# Patient Record
Sex: Male | Born: 1970 | Race: White | Hispanic: No | Smoking: Heavy tobacco smoker
Health system: Southern US, Community
[De-identification: ages and names within clinical notes are randomized; demographics above are authoritative.]

---

## 2019-05-18 ENCOUNTER — Emergency Department (HOSPITAL_COMMUNITY): Payer: Self-pay

## 2019-05-18 ENCOUNTER — Other Ambulatory Visit: Payer: Self-pay

## 2019-05-18 ENCOUNTER — Encounter (HOSPITAL_COMMUNITY): Payer: Self-pay | Admitting: Emergency Medicine

## 2019-05-18 ENCOUNTER — Emergency Department (HOSPITAL_COMMUNITY)
Admission: EM | Admit: 2019-05-18 | Discharge: 2019-05-18 | Disposition: A | Discharge status: Home / Self Care | Payer: Self-pay | Attending: Emergency Medicine | Admitting: Emergency Medicine

## 2019-05-18 DIAGNOSIS — R05 Cough: Secondary | ICD-10-CM | POA: Insufficient documentation

## 2019-05-18 DIAGNOSIS — F17210 Nicotine dependence, cigarettes, uncomplicated: Secondary | ICD-10-CM | POA: Insufficient documentation

## 2019-05-18 DIAGNOSIS — U071 COVID-19: Secondary | ICD-10-CM | POA: Insufficient documentation

## 2019-05-18 DIAGNOSIS — Z20822 Contact with and (suspected) exposure to covid-19: Secondary | ICD-10-CM

## 2019-05-18 MED ORDER — PREDNISONE 10 MG (21) PO TBPK: ORAL_TABLET | ORAL | 0 refills | Status: AC

## 2019-05-18 MED ORDER — DEXAMETHASONE SODIUM PHOSPHATE 10 MG/ML IJ SOLN
10.0000 mg | Freq: Once | INTRAMUSCULAR | Status: AC
  Administered 2019-05-18: 10 mg via INTRAMUSCULAR
  Filled 2019-05-18: qty 1

## 2019-05-18 MED ORDER — AEROCHAMBER PLUS FLO-VU MEDIUM MISC
1.0000 | Freq: Once | Status: AC
  Administered 2019-05-18: 1
  Filled 2019-05-18: qty 1

## 2019-05-18 MED ORDER — ALBUTEROL SULFATE HFA 108 (90 BASE) MCG/ACT IN AERS
2.0000 | INHALATION_SPRAY | RESPIRATORY_TRACT | Status: DC | PRN
  Filled 2019-05-18: qty 6.7

## 2019-05-18 MED ORDER — ACETAMINOPHEN 500 MG PO TABS
1000.0000 mg | ORAL_TABLET | Freq: Once | ORAL | Status: AC
  Administered 2019-05-18: 22:00:00 1000 mg via ORAL
  Filled 2019-05-18: qty 2

## 2019-05-18 NOTE — ED Triage Notes (Signed)
Pt reports body aches and cough since yesterday along with fever but not obtained at home.

## 2019-05-18 NOTE — ED Provider Notes (Signed)
[facility]-EMERGENCY DEPT Provider Note   CSN: [id number] Arrival date & time: 05/18/19  2028     History   Chief Complaint Chief Complaint  Patient presents with  . Generalized Body Aches  . Cough    HPI Brett Holloway is a 48 y.o. male.     Pt presents to the ED today with "flu-like symptoms."  The pt said he started getting sick yesterday.  He took a nyquil which has not helped.  He felt like he had a fever at home.  The pt has not taken any tylenol or ibuprofen today.  No known covid exposures.     History reviewed. No pertinent past medical history.  There are no active problems to display for this patient.   History reviewed. No pertinent surgical history.      Home Medications    Prior to Admission medications   Medication Sig Start Date End Date Taking? Authorizing Provider  predniSONE (STERAPRED UNI-PAK 21 TAB) 10 MG (21) TBPK tablet Take 6 tabs for 2 days, then 5 for 2 days, then 4 for 2 days, then 3 for 2 days, 2 for 2 days, then 1 for 2 days 05/18/19   Jacalyn Lefevre, MD    Family History No family history on file.  Social History Social History   Tobacco Use  . Smoking status: Heavy Tobacco Smoker    Packs/day: 0.50    Types: Cigarettes  . Smokeless tobacco: Never Used  Substance Use Topics  . Alcohol use: Never    Frequency: Never  . Drug use: Never     Allergies   Sulfa antibiotics   Review of Systems Review of Systems  Constitutional: Positive for fever.  Respiratory: Positive for cough.   All other systems reviewed and are negative.    Physical Exam Updated Vital Signs BP 113/63   Pulse 88   Temp 99.5 F (37.5 C) (Oral)   Resp 15   Ht 5\' 5"  (1.651 m)   Wt 59 kg   SpO2 93%   BMI 21.63 kg/m   Physical Exam Vitals signs and nursing note reviewed.  Constitutional:      Appearance: Normal appearance.  HENT:     Head: Normocephalic and atraumatic.     Right Ear: External ear normal.     Left  Ear: External ear normal.     Nose: Nose normal.     Mouth/Throat:     Mouth: Mucous membranes are moist.     Pharynx: Oropharynx is clear.  Eyes:     Extraocular Movements: Extraocular movements intact.     Conjunctiva/sclera: Conjunctivae normal.     Pupils: Pupils are equal, round, and reactive to light.  Neck:     Musculoskeletal: Normal range of motion and neck supple.  Cardiovascular:     Rate and Rhythm: Regular rhythm. Tachycardia present.     Pulses: Normal pulses.     Heart sounds: Normal heart sounds.  Pulmonary:     Breath sounds: Wheezing present.  Abdominal:     General: Abdomen is flat. Bowel sounds are normal.     Palpations: Abdomen is soft.  Musculoskeletal: Normal range of motion.  Skin:    General: Skin is warm.     Capillary Refill: Capillary refill takes less than 2 seconds.  Neurological:     General: No focal deficit present.     Mental Status: He is alert and oriented to person, place, and time.  Psychiatric:  Mood and Affect: Mood normal.        Behavior: Behavior normal.      ED Treatments / Results  Labs (all labs ordered are listed, but only abnormal results are displayed) Labs Reviewed  SARS CORONAVIRUS 2 (TAT 6-24 HRS)    EKG None  Radiology Dg Chest Port 1 View  Result Date: 05/18/2019 CLINICAL DATA:  Cough and fever EXAM: PORTABLE CHEST 1 VIEW COMPARISON:  None. FINDINGS: The heart size and mediastinal contours are within normal limits. Both lungs are clear. The visualized skeletal structures are unremarkable. IMPRESSION: No active disease. Electronically Signed   By: Prudencio Pair M.D.   On: 05/18/2019 21:57    Procedures Procedures (including critical care time)  Medications Ordered in ED Medications  albuterol (VENTOLIN HFA) 108 (90 Base) MCG/ACT inhaler 2 puff (has no administration in time range)  dexamethasone (DECADRON) injection 10 mg (10 mg Intramuscular Given 05/18/19 2216)  acetaminophen (TYLENOL) tablet 1,000 mg  (1,000 mg Oral Given 05/18/19 2215)  AeroChamber Plus Flo-Vu Medium MISC 1 each (1 each Other Given 05/18/19 2221)     Initial Impression / Assessment and Plan / ED Course  I have reviewed the triage vital signs and the nursing notes.  Pertinent labs & imaging results that were available during my care of the patient were reviewed by me and considered in my medical decision making (see chart for details).     CXR is clear and O2 sats are good.  Pt will be d/c home with steroids and is instructed to return if worse.  He likely has covid.  Swab is pending at d/c.    Brett Holloway was evaluated in Emergency Department on 05/18/2019 for the symptoms described in the history of present illness. He was evaluated in the context of the global COVID-19 pandemic, which necessitated consideration that the patient might be at risk for infection with the SARS-CoV-2 virus that causes COVID-19. Institutional protocols and algorithms that pertain to the evaluation of patients at risk for COVID-19 are in a state of rapid change based on information released by regulatory bodies including the CDC and federal and state organizations. These policies and algorithms were followed during the patient's care in the ED.  Final Clinical Impressions(s) / ED Diagnoses   Final diagnoses:  Suspected COVID-19 virus infection    ED Discharge Orders         Ordered    predniSONE (STERAPRED UNI-PAK 21 TAB) 10 MG (21) TBPK tablet     05/18/19 2214           Isla Pence, MD 05/18/19 2251

## 2019-05-19 LAB — SARS CORONAVIRUS 2 (TAT 6-24 HRS): SARS Coronavirus 2: POSITIVE — AB

## 2019-06-02 ENCOUNTER — Emergency Department (HOSPITAL_COMMUNITY): Admission: EM | Admit: 2019-06-02 | Discharge: 2019-06-02 | Discharge status: Against Medical Advice | Payer: Self-pay

## 2020-10-19 IMAGING — DX DG CHEST 1V PORT
1 series · 1 of 1 positions shown · non-contrast
Comparison: None.

CLINICAL DATA: Cough and fever

EXAM:
PORTABLE CHEST 1 VIEW

[chest ap]
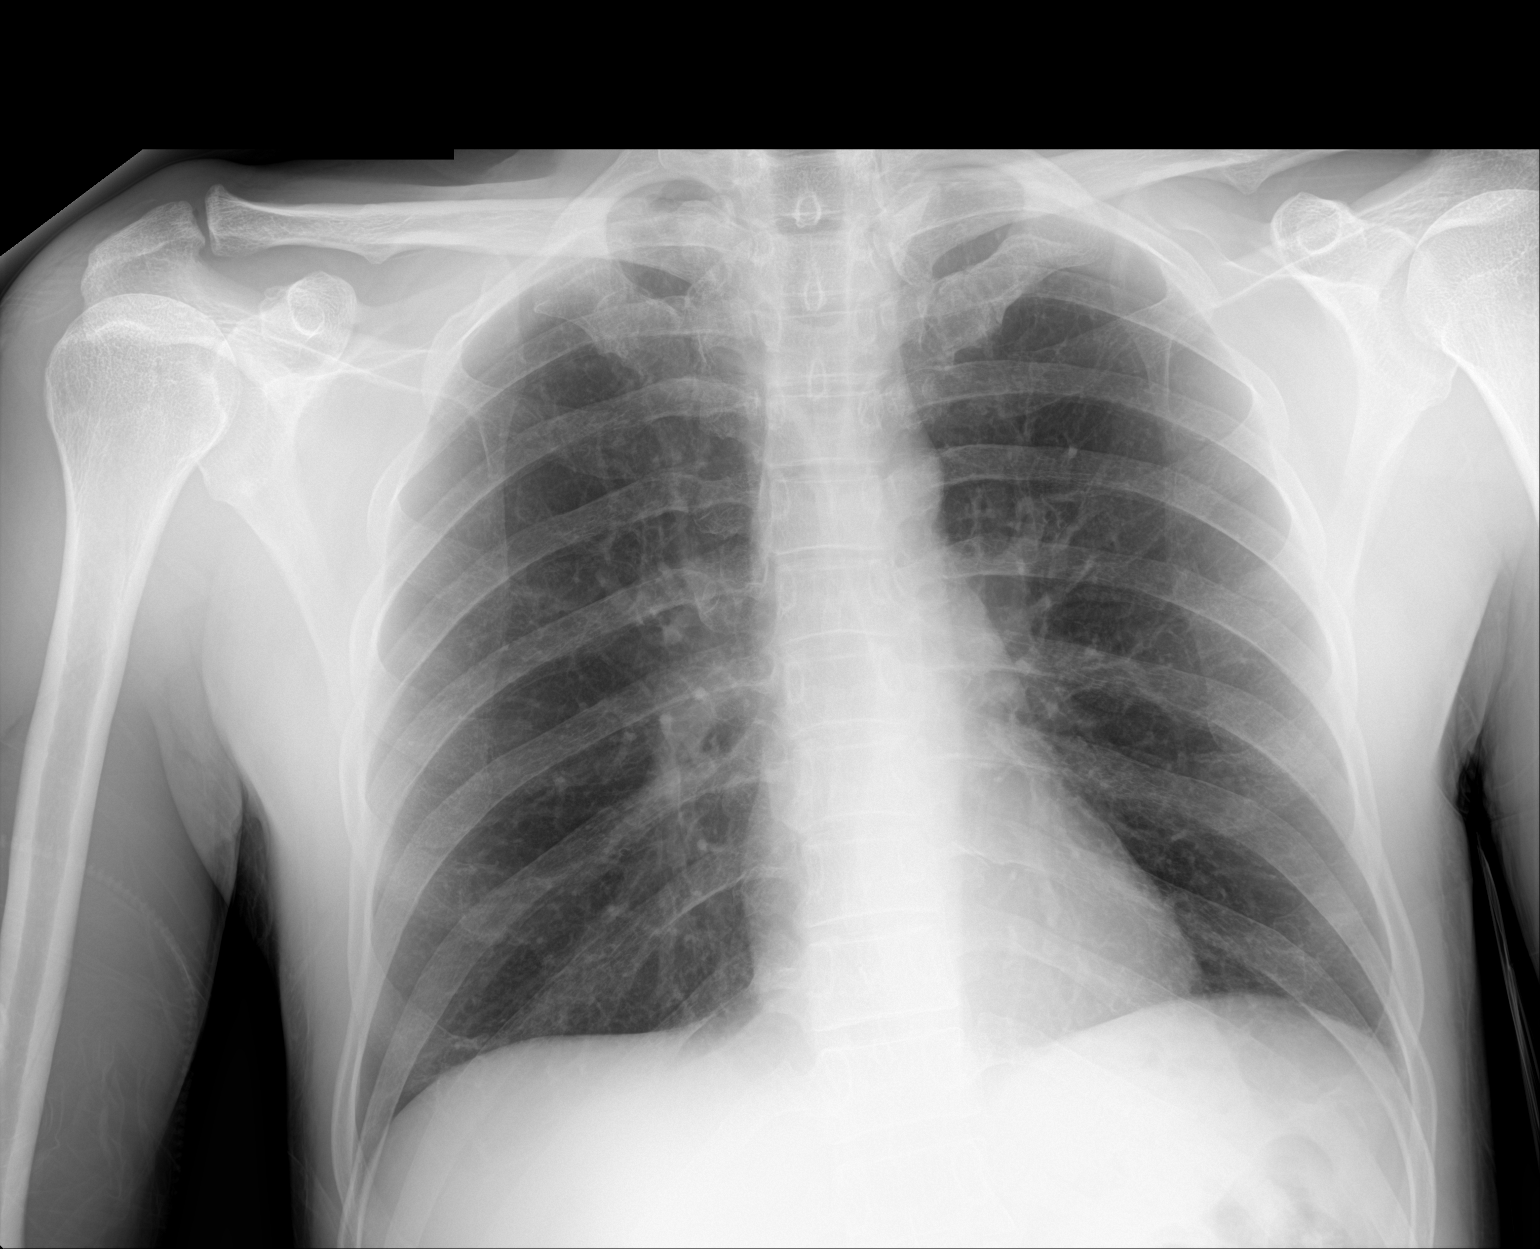

[1 of 1 positions shown; findings below may reference images not displayed]

FINDINGS: The heart size and mediastinal contours are within normal limits.
Both lungs are clear. The visualized skeletal structures are
unremarkable.
IMPRESSION: No active disease.

## 2021-01-08 ENCOUNTER — Encounter (HOSPITAL_COMMUNITY): Payer: Self-pay | Admitting: Emergency Medicine

## 2021-01-08 ENCOUNTER — Emergency Department (HOSPITAL_COMMUNITY): Payer: Self-pay

## 2021-01-08 ENCOUNTER — Emergency Department (HOSPITAL_COMMUNITY)
Admission: EM | Admit: 2021-01-08 | Discharge: 2021-01-09 | Disposition: A | Discharge status: Home / Self Care | Payer: Self-pay | Attending: Medical | Admitting: Medical

## 2021-01-08 ENCOUNTER — Other Ambulatory Visit: Payer: Self-pay

## 2021-01-08 DIAGNOSIS — Z5321 Procedure and treatment not carried out due to patient leaving prior to being seen by health care provider: Secondary | ICD-10-CM | POA: Insufficient documentation

## 2021-01-08 DIAGNOSIS — U071 COVID-19: Secondary | ICD-10-CM | POA: Insufficient documentation

## 2021-01-08 NOTE — ED Notes (Signed)
Called pt for vitals, no response x1 

## 2021-01-08 NOTE — ED Triage Notes (Signed)
C/o cough, body aches, and fever x 3 weeks.  Tested negative for COVID 1 week ago.

## 2021-01-08 NOTE — ED Provider Notes (Signed)
Emergency Medicine Provider Triage Evaluation Note  Brett Holloway , a 50 y.o. male  was evaluated in triage.  Pt complains of productive cough, subjective fevers, chills, body aches for the last 3 weeks. Hx of bronchitis in the past; typically lasts and week and then dissipates. No relief with OTC. [place] [facility] [place]. Unvaccinated. Has has COVID twice since the pandemic.   Review of Systems  Positive: + cough, body aches, fevers, chills Negative: - chest pain, SOB  Physical Exam  BP 125/83 (BP Location: Left Arm)   Pulse 92   Temp 98.7 F (37.1 C) (Oral)   Resp 16   SpO2 90%  Gen:   Awake, no distress   Resp:  Normal effort  MSK:   Moves extremities without difficulty  Other:    Medical Decision Making  Medically screening exam initiated at 5:07 PM.  Appropriate orders placed.  Brett Holloway was informed that the remainder of the evaluation will be completed by another provider, this initial triage assessment does not replace that evaluation, and the importance of remaining in the ED until their evaluation is complete.     Tanda Rockers, PA-C 01/08/21 1709    Koleen Distance, MD 01/08/21 1725

## 2021-01-08 NOTE — ED Notes (Signed)
Called pt for vitals, no response. Moving to OTF

## 2021-01-09 LAB — SARS CORONAVIRUS 2 (TAT 6-24 HRS): SARS Coronavirus 2: POSITIVE — AB
